# Patient Record
Sex: Male | Born: 1960 | Race: White | Hispanic: No | Marital: Married | Smoking: Never smoker
Health system: Southern US, Community
[De-identification: ages and names within clinical notes are randomized; demographics above are authoritative.]

## PROBLEM LIST (undated history)

## (undated) DIAGNOSIS — C801 Malignant (primary) neoplasm, unspecified: Secondary | ICD-10-CM

## (undated) DIAGNOSIS — N4400 Torsion of testis, unspecified: Secondary | ICD-10-CM

## (undated) HISTORY — PX: APPENDECTOMY: SHX54

## (undated) HISTORY — DX: Torsion of testis, unspecified: N44.00

## (undated) HISTORY — DX: Malignant (primary) neoplasm, unspecified: C80.1

## (undated) HISTORY — PX: TESTICLE TORSION REDUCTION: SHX795

---

## 2000-12-16 ENCOUNTER — Encounter: Admission: RE | Admit: 2000-12-16 | Discharge: 2001-02-05 | Payer: Self-pay | Admitting: Family Medicine

## 2004-05-23 ENCOUNTER — Encounter: Admission: RE | Admit: 2004-05-23 | Discharge: 2004-05-23 | Payer: Self-pay | Admitting: Family Medicine

## 2006-01-13 ENCOUNTER — Ambulatory Visit: Payer: Self-pay | Admitting: Family Medicine

## 2006-03-20 IMAGING — CR DG CHEST 2V
2 series · 2 of 2 positions shown · non-contrast
Comparison: none

CLINICAL DATA: Fever off and on for two weeks.  Non productive cough.  Hoarseness.  Non smoker. 
 PA AND LATERAL CHEST: 
 No comparison. 
 Hyperinflation of the lung zones without evidence of infiltrate, congestive heart failure, or pneumothorax.  Mediastinal and cardiac silhouette within normal limits.  If there is a persistent unexplained cough or hoarseness, chest CT to exclude underlying radiographically occult pulmonary malignancy recommended.

[view not recorded (1 of 2)]
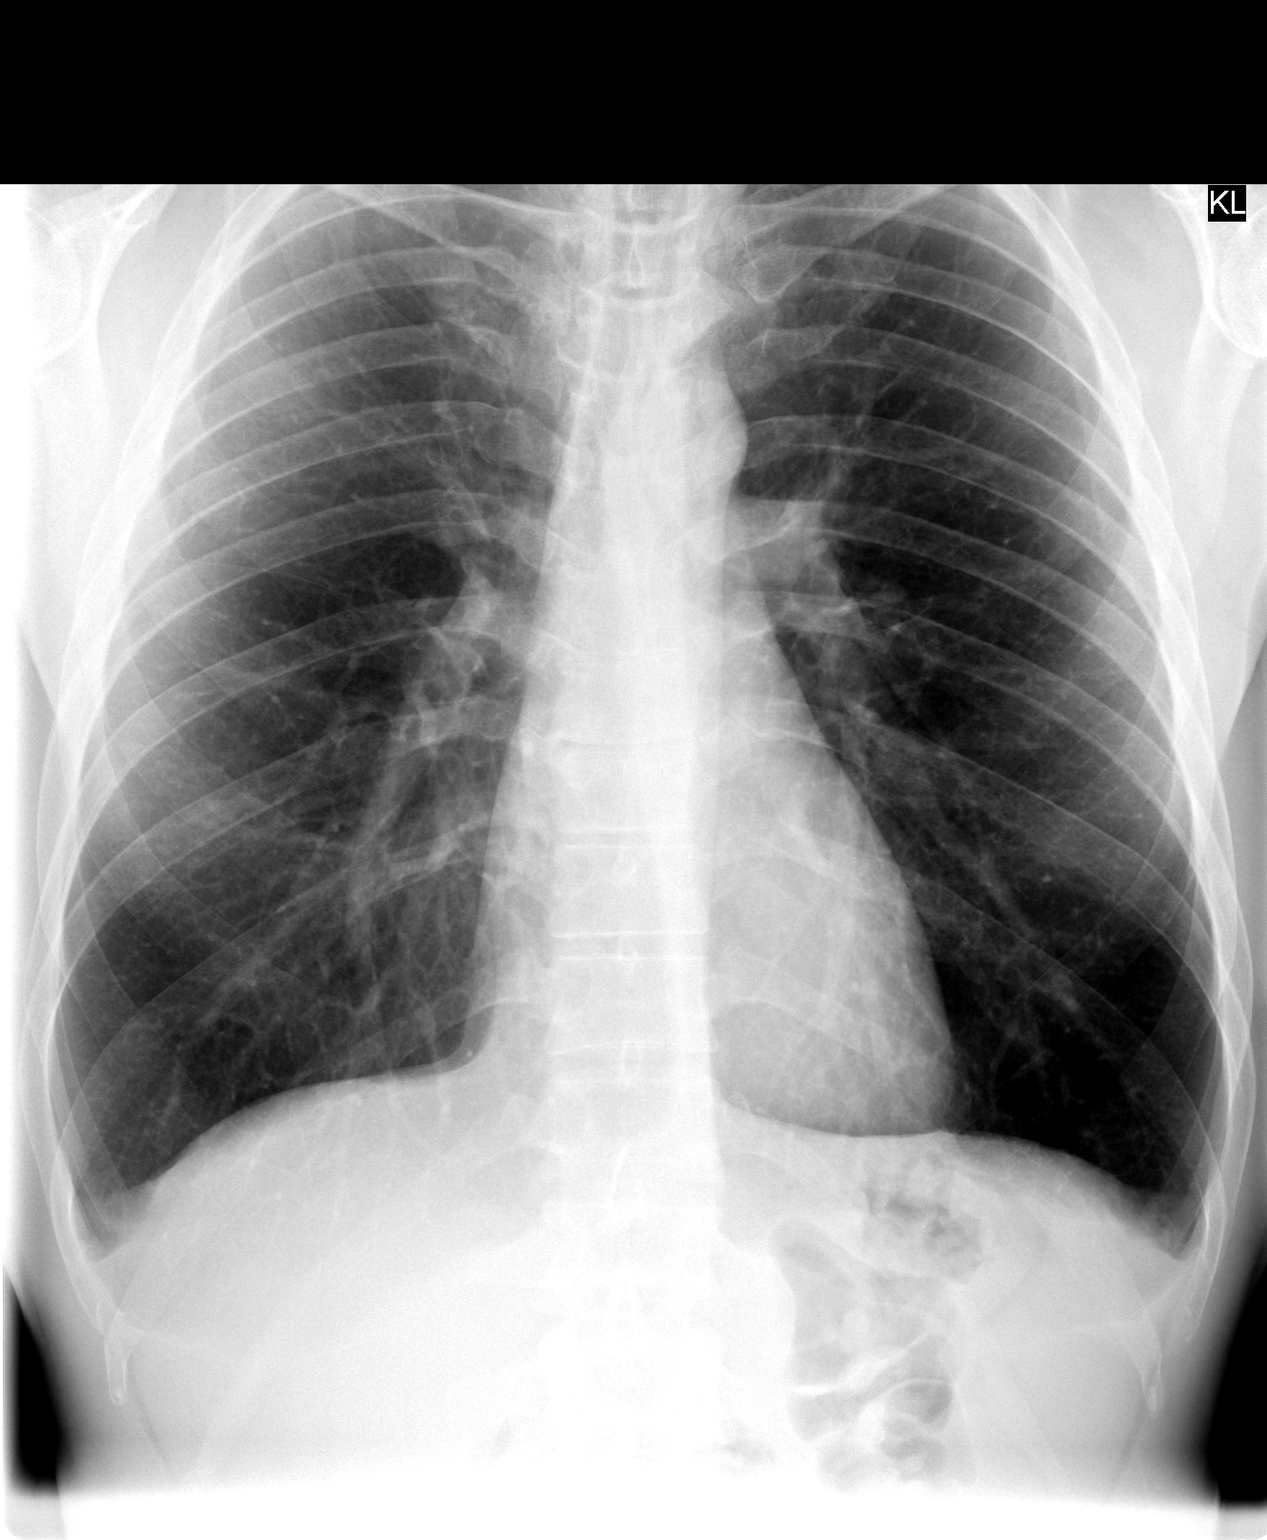

[view not recorded (2 of 2)]
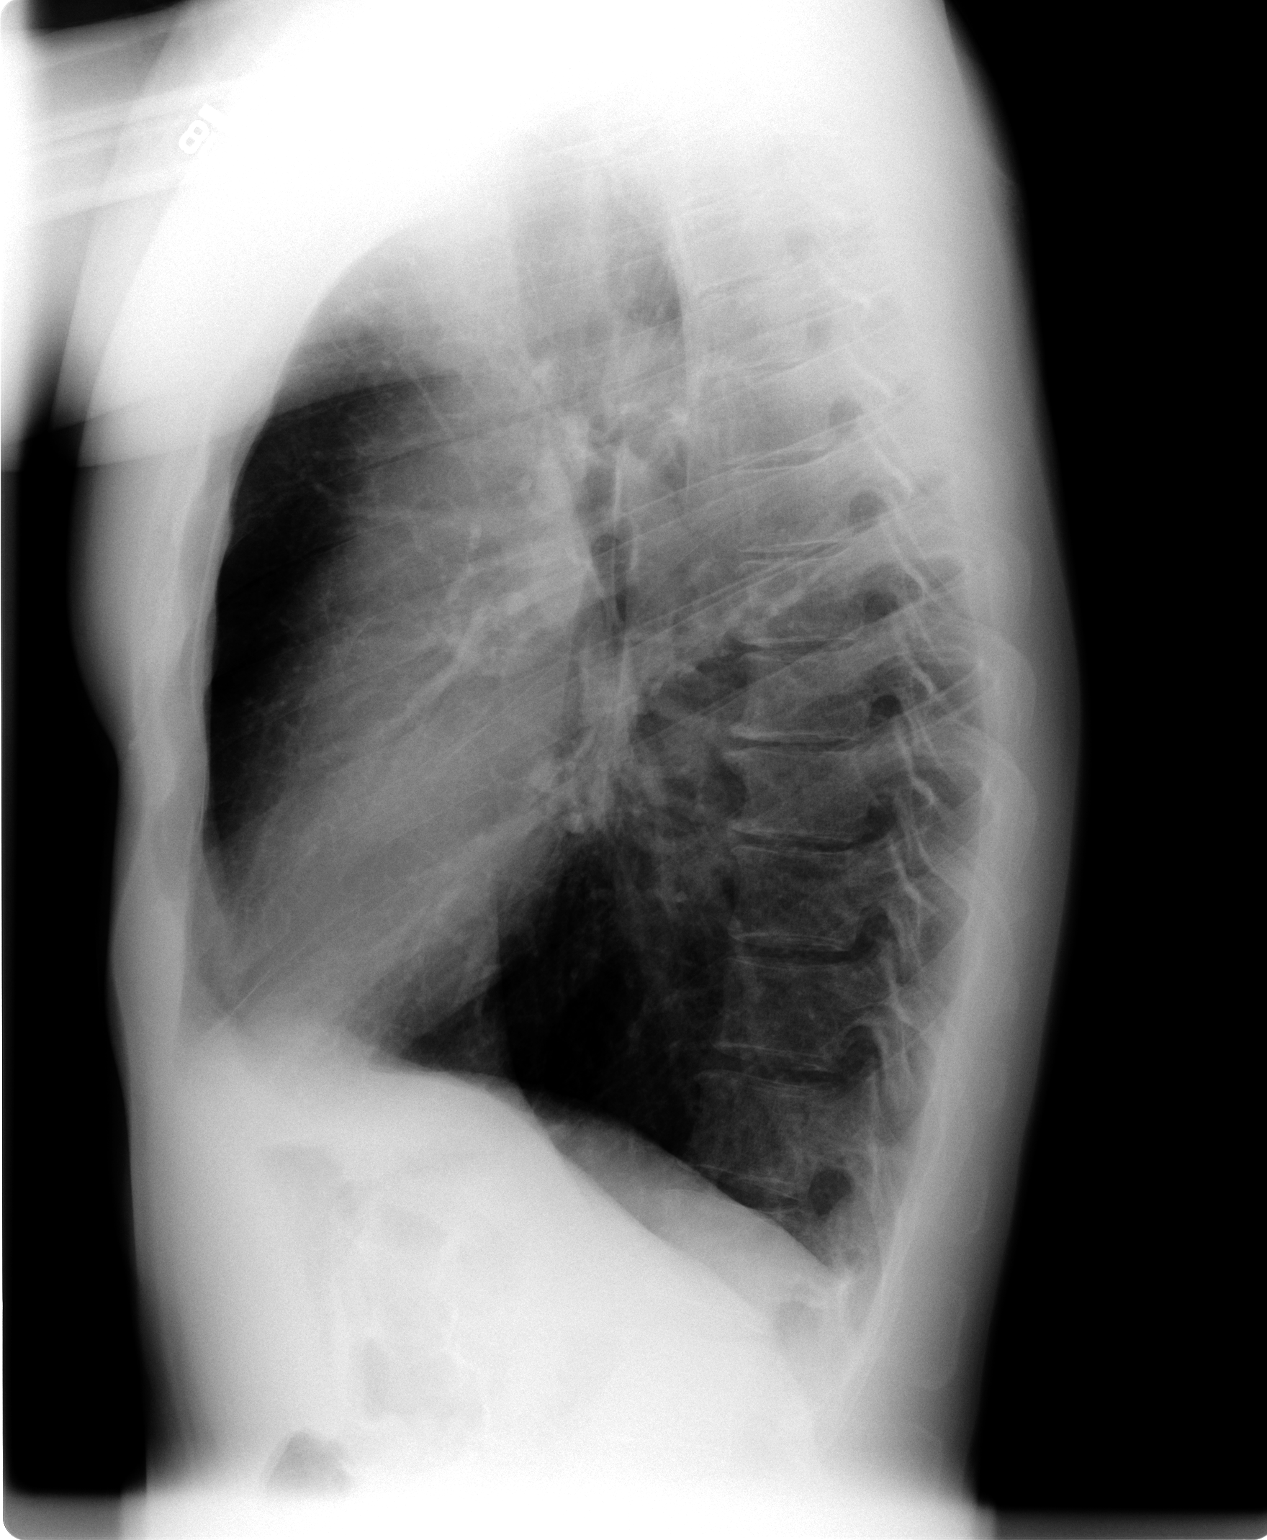

[2 of 2 positions shown; findings below may reference images not displayed]

IMPRESSION: Hyperinflation of the lung zones without evidence of infiltrative or congestive heart failure.  Follow up as noted above.

## 2006-04-16 ENCOUNTER — Ambulatory Visit: Payer: Self-pay | Admitting: Family Medicine

## 2006-04-16 LAB — CONVERTED CEMR LAB
Glucose, Bld: 0 mg/dL — CL (ref 70–99)
LDL Cholesterol: 132 mg/dL — ABNORMAL HIGH (ref 0–99)
LDL DIRECT: 125.7 mg/dL
PSA: 1.63 ng/mL (ref 0.10–4.00)
VLDL: 15 mg/dL (ref 0–40)

## 2006-04-17 ENCOUNTER — Encounter (INDEPENDENT_AMBULATORY_CARE_PROVIDER_SITE_OTHER): Payer: Self-pay | Admitting: Family Medicine

## 2006-04-17 LAB — CONVERTED CEMR LAB: Glucose, Bld: 90 mg/dL (ref 70–99)

## 2008-05-15 ENCOUNTER — Emergency Department (HOSPITAL_BASED_OUTPATIENT_CLINIC_OR_DEPARTMENT_OTHER): Admission: EM | Admit: 2008-05-15 | Discharge: 2008-05-15 | Payer: Self-pay | Admitting: Emergency Medicine

## 2008-05-16 ENCOUNTER — Ambulatory Visit: Payer: Self-pay | Admitting: Family Medicine

## 2008-05-16 DIAGNOSIS — R197 Diarrhea, unspecified: Secondary | ICD-10-CM | POA: Insufficient documentation

## 2008-05-17 ENCOUNTER — Encounter: Payer: Self-pay | Admitting: Family Medicine

## 2008-05-19 ENCOUNTER — Telehealth (INDEPENDENT_AMBULATORY_CARE_PROVIDER_SITE_OTHER): Payer: Self-pay | Admitting: *Deleted

## 2008-05-27 ENCOUNTER — Encounter (INDEPENDENT_AMBULATORY_CARE_PROVIDER_SITE_OTHER): Payer: Self-pay | Admitting: *Deleted

## 2008-05-27 ENCOUNTER — Ambulatory Visit: Payer: Self-pay | Admitting: Family Medicine

## 2008-05-27 LAB — CONVERTED CEMR LAB
ALT: 24 units/L (ref 0–53)
AST: 26 units/L (ref 0–37)
Albumin: 4.2 g/dL (ref 3.5–5.2)
BUN: 23 mg/dL (ref 6–23)
Basophils Relative: 0.2 % (ref 0.0–3.0)
Bilirubin, Direct: 0.1 mg/dL (ref 0.0–0.3)
Calcium: 9.4 mg/dL (ref 8.4–10.5)
Chloride: 102 meq/L (ref 96–112)
Creatinine, Ser: 1 mg/dL (ref 0.4–1.5)
Direct LDL: 125.8 mg/dL
Eosinophils Absolute: 0.1 10*3/uL (ref 0.0–0.7)
Eosinophils Relative: 1.9 % (ref 0.0–5.0)
GFR calc Af Amer: 103 mL/min
GFR calc non Af Amer: 85 mL/min
HCT: 42.1 % (ref 39.0–52.0)
HDL: 59.9 mg/dL (ref >39.0)
Hemoglobin: 14.4 g/dL (ref 13.0–17.0)
MCHC: 34.2 g/dL (ref 30.0–36.0)
MCV: 88.7 fL (ref 78.0–100.0)
Monocytes Absolute: 0.4 10*3/uL (ref 0.1–1.0)
Neutro Abs: 3.7 10*3/uL (ref 1.4–7.7)
Neutrophils Relative %: 72.8 % (ref 43.0–77.0)
RBC: 4.75 M/uL (ref 4.22–5.81)
Sed Rate: 10 mm/hr (ref 0–16)
TSH: 1.37 microintl units/mL (ref 0.35–5.50)
WBC: 5.1 10*3/uL (ref 4.5–10.5)

## 2008-05-28 ENCOUNTER — Encounter: Payer: Self-pay | Admitting: Family Medicine

## 2008-06-01 ENCOUNTER — Telehealth (INDEPENDENT_AMBULATORY_CARE_PROVIDER_SITE_OTHER): Payer: Self-pay | Admitting: *Deleted

## 2008-06-09 ENCOUNTER — Telehealth (INDEPENDENT_AMBULATORY_CARE_PROVIDER_SITE_OTHER): Payer: Self-pay | Admitting: *Deleted

## 2009-09-08 ENCOUNTER — Telehealth (INDEPENDENT_AMBULATORY_CARE_PROVIDER_SITE_OTHER): Payer: Self-pay | Admitting: *Deleted

## 2009-09-12 ENCOUNTER — Telehealth (INDEPENDENT_AMBULATORY_CARE_PROVIDER_SITE_OTHER): Payer: Self-pay | Admitting: *Deleted

## 2010-01-29 ENCOUNTER — Encounter (INDEPENDENT_AMBULATORY_CARE_PROVIDER_SITE_OTHER): Payer: Self-pay | Admitting: *Deleted

## 2010-01-29 LAB — CONVERTED CEMR LAB
Albumin: 4.4 g/dL
BUN: 27 mg/dL
Calcium: 9.1 mg/dL
Creatinine, Ser: 0.97 mg/dL
HCT: 44.2 %
HDL: 62 mg/dL
LDL Cholesterol: 101 mg/dL
MCV: 90.8 fL
RBC count: 4.9 10*6/uL
Triglycerides: 116 mg/dL
WBC, blood: 4.2 10*3/uL

## 2010-05-10 ENCOUNTER — Encounter (INDEPENDENT_AMBULATORY_CARE_PROVIDER_SITE_OTHER): Payer: Self-pay | Admitting: *Deleted

## 2010-05-10 ENCOUNTER — Encounter (INDEPENDENT_AMBULATORY_CARE_PROVIDER_SITE_OTHER): Payer: Commercial Managed Care - PPO | Admitting: Family Medicine

## 2010-05-10 ENCOUNTER — Other Ambulatory Visit: Payer: Self-pay | Admitting: Family Medicine

## 2010-05-10 ENCOUNTER — Encounter: Payer: Self-pay | Admitting: Family Medicine

## 2010-05-10 ENCOUNTER — Ambulatory Visit: Admit: 2010-05-10 | Payer: Self-pay | Admitting: Family Medicine

## 2010-05-10 DIAGNOSIS — Z0000 Encounter for general adult medical examination without abnormal findings: Secondary | ICD-10-CM

## 2010-05-10 DIAGNOSIS — R6882 Decreased libido: Secondary | ICD-10-CM | POA: Insufficient documentation

## 2010-05-10 DIAGNOSIS — E162 Hypoglycemia, unspecified: Secondary | ICD-10-CM | POA: Insufficient documentation

## 2010-05-10 DIAGNOSIS — Z20828 Contact with and (suspected) exposure to other viral communicable diseases: Secondary | ICD-10-CM

## 2010-05-10 NOTE — Progress Notes (Signed)
Summary: wants rx sent to [facility]  Phone Note Call from Patient Call back at Work Phone [phone]   Caller: Patient Summary of Call: pt called wants rx sent to [facility] instead ov [facility](did not pickup)  RX REFAXED   left msg for pt.Kandice Hams  September 12, 2009 2:37 PM   Initial call taken by: Kandice Hams,  September 12, 2009 2:43 PM    Prescriptions: CIPROFLOXACIN HCL 500 MG TABS (CIPROFLOXACIN HCL) take one tablet two times a day  #10 x 0   Entered by:   Kandice Hams   Authorized by:   Neena Rhymes MD   Signed by:   Kandice Hams on 09/12/2009   Method used:   Re-Faxed to ...       [facility]* (retail)       [address]       [address] Shipping/mailing       [place], [place]  [address]       Ph: [phone]       Fax: [phone]   RxID:   [id number]

## 2010-05-10 NOTE — Progress Notes (Signed)
Summary: Meds for trip  Phone Note Call from Patient Call back at Home Phone [phone] Call back at Work Phone [phone]   Caller: Patient Reason for Call: Refill Medication Summary of Call: Patient is going to [place] again in August and would like a rx for doxycycline?? for the trip so he doesn't get sick again.   Pharmacy is [facility] on [address].  Initial call taken by: Harold Barban,  September 08, 2009 1:03 PM  Follow-up for Phone Call        ok for cipro 500mg  two times a day #10, no refills in case of travellers diarrhea (this is what pt had last time and is more appropriate than doxy) Follow-up by: Neena Rhymes MD,  September 08, 2009 1:15 PM  Additional Follow-up for Phone Call Additional follow up Details #1::        left message on machine ............Marland KitchenDoristine Devoid  September 08, 2009 3:13 PM   spoke w/ patient aware prescription to be called into pharmacy........Marland KitchenDoristine Devoid  September 08, 2009 3:47 PM     New/Updated Medications: CIPROFLOXACIN HCL 500 MG TABS (CIPROFLOXACIN HCL) take one tablet daily CIPROFLOXACIN HCL 500 MG TABS (CIPROFLOXACIN HCL) take one tablet two times a day Prescriptions: CIPROFLOXACIN HCL 500 MG TABS (CIPROFLOXACIN HCL) take one tablet two times a day  #10 x 0   Entered by:   Doristine Devoid   Authorized by:   Neena Rhymes MD   Signed by:   Doristine Devoid on 09/08/2009   Method used:   Telephoned to ...       [facility] [address] [id number]* (retail)       [address]       [place], [place]  [address]       Ph: [phone]       Fax: [phone]   RxID:   [id number] CIPROFLOXACIN HCL 500 MG TABS (CIPROFLOXACIN HCL) take one tablet daily  #10 x 0   Entered by:   Doristine Devoid   Authorized by:   Neena Rhymes MD   Signed by:   Doristine Devoid on 09/08/2009   Method used:   Electronically to        [facility] [address] [facility] [id number]* (retail)       [address]       [place], [place]  [address]       Ph: [phone]       Fax: [phone]   RxID:   [id number]

## 2010-05-11 ENCOUNTER — Telehealth (INDEPENDENT_AMBULATORY_CARE_PROVIDER_SITE_OTHER): Payer: Self-pay | Admitting: *Deleted

## 2010-05-14 ENCOUNTER — Encounter: Payer: Self-pay | Admitting: Family Medicine

## 2010-05-14 LAB — CONVERTED CEMR LAB: HIV: NONREACTIVE

## 2010-05-16 NOTE — Miscellaneous (Signed)
  Clinical Lists Changes  Observations: Added new observation of TRIGLYC TOT: 116 mg/dL (16/01/9603 54:09) Added new observation of LDL: 101 mg/dL (81/19/1478 29:56) Added new observation of HDL: 62 mg/dL (21/30/8657 84:69) Added new observation of CHOLESTEROL: 186 mg/dL (62/95/2841 32:44) Added new observation of BILI TOTAL: 0.80 mg/dL (04/10/7251 66:44) Added new observation of ALK PHOS: 49 units/L (01/29/2010 14:03) Added new observation of SGPT (ALT): 14 units/L (01/29/2010 14:03) Added new observation of SGOT (AST): 23 units/L (01/29/2010 14:03) Added new observation of PROTEIN, TOT: 6.8 g/dL (03/47/4259 56:38) Added new observation of ALBUMIN: 4.4 g/dL (75/64/3329 51:88) Added new observation of CALCIUM: 9.1 mg/dL (41/66/0630 16:01) Added new observation of GLUCOSE SER: 91 mg/dL (09/32/3557 32:20) Added new observation of CREATININE: 0.97 mg/dL (25/42/7062 37:62) Added new observation of BUN: 27 mg/dL (83/15/1761 60:73) Added new observation of CO2 TOTAL: 25 mmol/L (01/29/2010 14:03) Added new observation of CHLORIDE: 105 mmol/L (01/29/2010 14:03) Added new observation of POTASSIUM: 4.9 mmol/L (01/29/2010 14:03) Added new observation of SODIUM: 141 mmol/L (01/29/2010 14:03) Added new observation of PLATELETS: 205 10*3/mm3 (01/29/2010 14:03) Added new observation of MCH: 29.6 pg (01/29/2010 14:03) Added new observation of MCV: 90.8 fL (01/29/2010 14:03) Added new observation of HCT: 44.2 % (01/29/2010 14:03) Added new observation of HGB: 14.4 g/dL (71/09/2692 85:46) Added new observation of RBC: 4.9 10*6/mm3 (01/29/2010 14:03) Added new observation of WBC: 4.2 10*3/mm3 (01/29/2010 14:03)

## 2010-05-16 NOTE — Assessment & Plan Note (Signed)
Summary: CPX AND FASTING LABS///SPH   Vital Signs:  Patient profile:   50 year old male Height:      66 inches Weight:      145 pounds BMI:     23.49 Pulse rate:   62 / minute BP sitting:   110 / 72  (left arm)  Vitals Entered By: Doristine Devoid CMA (May 10, 2010 8:08 AM) CC: CPX   History of Present Illness: 50 yo man here today for CPE.  had labs done recently for health insurance- they look great  1) nausea- occuring every few weeks.  will have some light headedness.  sxs improve w/ eating.  sxs started w/in last year.  eating regularly.  drinking plenty of fluids.  2) low sex drive- concerned about testosterone levels.  reports more easily fatigued.  reports he used to easily gain muscle when working out but now must work harder.  denies ED.  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        Drug Use:  never.    Current Medications (verified): 1)  None  Allergies (verified): 1)  ! 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    Physical Exam  General:  Well-developed,well-nourished,in no acute distress; alert,appropriate and cooperative throughout examination Head:  Normocephalic and atraumatic without obvious abnormalities.  No apparent alopecia or balding. Eyes:  No corneal or conjunctival inflammation noted. EOMI. Perrla. Funduscopic exam benign, without hemorrhages, exudates or papilledema. Vision grossly normal. Ears:  External ear exam shows no significant lesions or deformities.  Otoscopic examination reveals clear canals, tympanic membranes are intact bilaterally without bulging, retraction, inflammation or discharge. Hearing is grossly normal bilaterally. Nose:  External nasal examination shows no deformity or inflammation. Nasal mucosa are pink and moist without lesions or exudates. Mouth:  Oral mucosa and oropharynx without lesions or exudates.  Teeth in good repair. Neck:  No deformities, masses, or tenderness noted. Lungs:  Normal respiratory effort, chest expands symmetrically. Lungs are clear to auscultation, no crackles or wheezes. Heart:  Normal rate and regular rhythm. S1 and S2 normal without gallop, murmur, click, rub or other extra sounds. Abdomen:  Bowel sounds positive,abdomen soft and non-tender without masses, organomegaly or hernias noted. Rectal:  No external abnormalities noted. Normal sphincter tone. No rectal masses or tenderness. Genitalia:  Testes bilaterally descended without nodularity, tenderness or masses. No scrotal masses or lesions. No penis lesions or urethral discharge. Prostate:  Prostate gland firm and smooth, no enlargement, nodularity, tenderness, mass, asymmetry or induration. Pulses:  +2 carotid, radial, DP Extremities:  No clubbing, cyanosis, edema, or deformity noted with normal full range of motion of all joints.   Neurologic:  No cranial nerve deficits noted. Station and gait are normal. Plantar reflexes are down-going bilaterally. DTRs are symmetrical throughout. Sensory, motor and coordinative functions appear intact. Skin:  Intact without suspicious  lesions or rashes Cervical Nodes:  No lymphadenopathy noted Inguinal Nodes:  No significant adenopathy Psych:   Cognition and judgment appear intact. Alert and cooperative with normal attention span and concentration. No apparent delusions, illusions, hallucinations   Impression & Recommendations:  Problem # 1:  HEALTHY ADULT MALE (ICD-V70.0) Assessment Unchanged pt's PE WNL.  labs reviewed from recent health screen.  EKG done as baseline.  anticipatory guidance provided. Orders: TLB-PSA (Prostate Specific Antigen) (84153-PSA) Specimen Handling (16109) EKG w/ Interpretation (93000)  Problem # 2:  DECREASED LIBIDO (ICD-799.81) Assessment: New given pt's low sex drive and some fatigue will check testosterone level.  reviewed this may be normal consequence of aging.  will follow. Orders: Venipuncture (60454) TLB-Testosterone, Total (84403-TESTO) Specimen Handling (09811)  Problem # 3:  CONTACT OR EXPOSURE TO OTHER VIRAL DISEASES (ICD-V01.79) Assessment: New partner w/ HIV. 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  discussed importance of protein at meals to avoid rapid spikes in blood sugar.  reviewed supportive care and red flags that should prompt return.  Pt expresses understanding and is in agreement w/ this plan.  Patient Instructions: 1)  Your exam looks great!  Keep up the good work! 2)  We'll notify you of your lab results 3)  The nausea and light headedness sounds like it is low blood sugar in response to a carb load.  Try and eat protein at every meal to level out the blood sugar 4)  Your decrease drive is most likely a normal aging process but we'll check the testosterone 5)  Call with any questions or concerns 6)  Happy Birthday!   Orders Added: 1)  Venipuncture [36415] 2)  TLB-Testosterone, Total [84403-TESTO] 3)  TLB-PSA (Prostate Specific Antigen)  [84153-PSA] 4)  T-HIV Antibody  (Reflex) [[id number]] 5)  Specimen Handling [99000] 6)  EKG w/ Interpretation [93000] 7)  Est. Patient 40-64 years [99396] 8)  Est. Patient Level III [28413]

## 2010-05-23 DIAGNOSIS — E291 Testicular hypofunction: Secondary | ICD-10-CM | POA: Insufficient documentation

## 2010-05-24 NOTE — Progress Notes (Addendum)
Summary: labs-lmom   Phone Note Outgoing Call   Call placed by: Doristine Devoid CMA,  May 11, 2010 9:23 AM Call placed to: Patient Summary of Call: testosterone is low.  will refer to endo for complete w/u.   pt aware.... Almeta Monas CMA Duncan Dull)  May 14, 2010 1:55 PM   Follow-up for Phone Call        left message on machine ...Marland KitchenMarland KitchenDoristine Devoid CMA  May 11, 2010 9:23 AM      Appended Document: Orders Update    Clinical Lists Changes  Problems: Added new problem of OTHER TESTICULAR HYPOFUNCTION (ICD-257.2) Orders: Added new Referral order of Endocrinology Referral (Endocrine) - Signed

## 2010-06-13 ENCOUNTER — Ambulatory Visit: Payer: Commercial Managed Care - PPO | Admitting: Endocrinology

## 2010-06-19 ENCOUNTER — Encounter: Payer: Self-pay | Admitting: Endocrinology

## 2010-06-19 ENCOUNTER — Ambulatory Visit (INDEPENDENT_AMBULATORY_CARE_PROVIDER_SITE_OTHER): Payer: 59 | Admitting: Endocrinology

## 2010-06-19 DIAGNOSIS — E291 Testicular hypofunction: Secondary | ICD-10-CM

## 2010-06-26 NOTE — Assessment & Plan Note (Signed)
Summary: New Endo/other testicular-hypo function/tabori/[facility]/   Vital Signs:  Patient profile:   50 year old male Height:      66 inches (167.64 cm) Weight:      148 pounds (67.27 kg) BMI:     23.97 O2 Sat:      99 % on Room air Temp:     98.5 degrees F (36.94 degrees C) oral Pulse rate:   49 / minute BP sitting:   110 / 64  (left arm) Cuff size:   regular  Vitals Entered By: Brenton Grills CMA Duncan Dull) (June 19, 2010 8:41 AM)  O2 Flow:  Room air CC: New Endo Consult/Other Testicular Hypofunction/Dr. Tabori/aj Is Patient Diabetic? No   Referring Provider:  Neena Rhymes MD Primary Provider:  Neena Rhymes MD  CC:  New Endo Consult/Other Testicular Hypofunction/Dr. Tabori/aj.  History of Present Illness: pt states 1 year of decreased muscle mass at the arms and legs, and assoc decreased libido.    Current Medications (verified): 1)  None  Allergies (verified): 1)  ! 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 coronary artery disease-maternal and paternal grandparent neg for testosterone problem.  Social History: Reviewed history from 05/10/2010 and no changes required. 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  he reports fatigue.  he reports urinary frequency and easy bruising.     Physical Exam  General:  normal appearance.   Head:  head: no deformity eyes: no periorbital swelling, no proptosis external nose and  ears are normal mouth: no lesion seen Neck:  Supple without thyroid enlargement or tenderness.  Breasts:  no gynecomastia Lungs:  Clear to auscultation bilaterally. Normal respiratory effort.  Heart:  Regular rate and rhythm without murmurs or gallops noted. Normal S1,S2.   Abdomen:  abdomen is soft, nontender.  no hepatosplenomegaly.   not distended.  no hernia  Genitalia:  Normal external male genitalia, except tetes are slightly small and soft (may be within the range of normal).  no urethral discharge.  Msk:  muscle bulk and strength are grossly normal.  no obvious joint swelling.  gait is normal and steady  Pulses:  dorsalis pedis intact bilat. Extremities:  no deformity.  no edema  Neurologic:  cn 2-12 grossly intact.   readily moves all 4's.   sensation is intact to touch on the feet Skin:  normal texture and temp.  no rash.  not diaphoretic hair distribution is decreased, but probably withn the normal range Cervical Nodes:  No significant adenopathy.  Psych:  Alert and cooperative; normal mood and affect; normal attention span and concentration.   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  however, there  are "theoretical" risks, including increased fertility, hair loss, prostate cancer, benign prostate enlargement, lower hdl ("good cholesterol"), sleep apnea, and behavior changes 2)  please consider options (including no treatment for now, and just recheck in 6-12 months), and let me know if you want treatment.  if you do, the next step would be to do other blood tests to classify the  cause as from the pituitary (in the brain), or from the testicles).     Orders Added: 1)  Consultation Level IV [40981]

## 2010-07-24 LAB — COMPREHENSIVE METABOLIC PANEL
AST: 27 U/L (ref 0–37)
Albumin: 4.2 g/dL (ref 3.5–5.2)
Alkaline Phosphatase: 52 U/L (ref 39–117)
Chloride: 101 mEq/L (ref 96–112)
Creatinine, Ser: 1 mg/dL (ref 0.4–1.5)
GFR calc Af Amer: >60 mL/min (ref >60)
Potassium: 4.3 mEq/L (ref 3.5–5.1)
Total Bilirubin: 0.6 mg/dL (ref 0.3–1.2)

## 2010-07-24 LAB — CLOSTRIDIUM DIFFICILE EIA: C difficile Toxins A+B, EIA: NEGATIVE

## 2010-07-24 LAB — DIFFERENTIAL
Basophils Absolute: 0 10*3/uL (ref 0.0–0.1)
Lymphs Abs: 0.8 10*3/uL (ref 0.7–4.0)
Monocytes Relative: 32 % — ABNORMAL HIGH (ref 3–12)
Neutro Abs: 1.7 10*3/uL (ref 1.7–7.7)

## 2010-07-24 LAB — URINALYSIS, ROUTINE W REFLEX MICROSCOPIC
Bilirubin Urine: NEGATIVE
Glucose, UA: NEGATIVE mg/dL
Hgb urine dipstick: NEGATIVE
Specific Gravity, Urine: 1.017 (ref 1.005–1.030)
Urobilinogen, UA: 0.2 mg/dL (ref 0.0–1.0)

## 2010-07-24 LAB — CBC
Platelets: 186 10*3/uL (ref 150–400)
WBC: 3.8 10*3/uL — ABNORMAL LOW (ref 4.0–10.5)

## 2010-08-06 ENCOUNTER — Encounter: Payer: Self-pay | Admitting: Family Medicine

## 2010-08-06 ENCOUNTER — Ambulatory Visit (INDEPENDENT_AMBULATORY_CARE_PROVIDER_SITE_OTHER): Payer: 59 | Admitting: Family Medicine

## 2010-08-06 VITALS — BP 100/70 | Temp 98.6°F | Wt 148.4 lb

## 2010-08-06 DIAGNOSIS — R29898 Other symptoms and signs involving the musculoskeletal system: Secondary | ICD-10-CM

## 2010-08-06 NOTE — Progress Notes (Signed)
  Subjective:    Patient ID: Jose Jacobson, male    DOB: 03-21-1961, 50 y.o.   MRN: [id number]  HPI L knee popping- sxs started last week while walking down stairs.  Denies knee pain, injury.  Pt very active- does a lot of biking.  No swelling, redness.   Review of Systems For ROS see HPI     Objective:   Physical Exam  Constitutional: He appears well-developed and well-nourished. No distress.  Musculoskeletal:       Left knee: He exhibits normal range of motion, no swelling, no effusion, no ecchymosis, no deformity, no erythema, normal alignment and normal patellar mobility. no tenderness found. No medial joint line, no lateral joint line and no patellar tendon tenderness noted.       + crepitus w/ flexion and extension of L knee (no pain) + 'snapping' sound w/ L leg squat          Assessment & Plan:

## 2010-08-06 NOTE — Patient Instructions (Signed)
I'll get back to you regarding the knee popping If it becomes painful- let me know about it At this point, i see know reason to limit your activities. Call with any questions or concerns Hang in there!!!

## 2010-08-07 DIAGNOSIS — R29898 Other symptoms and signs involving the musculoskeletal system: Secondary | ICD-10-CM | POA: Insufficient documentation

## 2010-08-07 NOTE — Assessment & Plan Note (Addendum)
Noise heard is more of a snapping than a clicking- painless.  PE WNL.  Will ask sports med if there is anything to be done but given absence of pain and normal exam reassured pt that there is no need to limit his activities.  Spoke w/ sports med and they recommended VMO strengthening but no additonal w/u at this time.  Will let pt know.

## 2010-08-20 ENCOUNTER — Telehealth: Payer: Self-pay | Admitting: *Deleted

## 2010-08-20 NOTE — Telephone Encounter (Signed)
Left message on voicemail to call the office

## 2010-08-20 NOTE — Telephone Encounter (Signed)
Via staff message Dr. Beverely Low advised:    Please call him and let him know that sports med doesn't recommend additional w/u for his knee 'snapping' as long as it remains painless. They suggested VMO (medial thigh strengthening) by sitting w/ legs straight out, turning toe outward and lifting leg 30 times- 3 sets. He should call w/ questions or concerns.  Thanks!

## 2010-08-21 NOTE — Telephone Encounter (Signed)
Discuss with patient  

## 2010-10-08 ENCOUNTER — Telehealth: Payer: Self-pay | Admitting: *Deleted

## 2010-10-08 MED ORDER — CIPROFLOXACIN HCL 500 MG PO TABS: 500.0000 mg | ORAL_TABLET | Freq: Two times a day (BID) | ORAL | Status: AC

## 2010-10-08 NOTE — Telephone Encounter (Signed)
Ok for Cipro 500mg  1 tab po bid x7 days (#14, no refills)

## 2010-10-08 NOTE — Telephone Encounter (Signed)
Pt is planning a trip to [place] from August 2-13. Pt states that the last time he went a med was Rx just in case. Pt is requesting another Rx to take with him. Please advise

## 2010-10-08 NOTE — Telephone Encounter (Signed)
Left Pt detail message Rx sent to pharmacy.  

## 2011-03-05 ENCOUNTER — Telehealth: Payer: Self-pay

## 2011-03-05 MED ORDER — CIPROFLOXACIN HCL 500 MG PO TABS: 500.0000 mg | ORAL_TABLET | Freq: Two times a day (BID) | ORAL | Status: AC

## 2011-03-05 NOTE — Telephone Encounter (Signed)
Pt requesting the abx that has been Rx'ed by Dr. Beverely Low in the past for when he goes to [place]. He states that he is going there soon.

## 2011-03-05 NOTE — Telephone Encounter (Signed)
rx sent to pharmacy by e-script  

## 2011-03-05 NOTE — Telephone Encounter (Signed)
Ok for Cipro 500mg  bid x7 days, #14, no refills

## 2011-07-02 ENCOUNTER — Telehealth: Payer: Self-pay | Admitting: *Deleted

## 2011-07-02 NOTE — Telephone Encounter (Signed)
Pt states that he usually get Rx for cipro when he travels to [place].Please advise

## 2011-07-03 MED ORDER — CIPROFLOXACIN HCL 500 MG PO TABS: 500.0000 mg | ORAL_TABLET | Freq: Two times a day (BID) | ORAL | Status: AC

## 2011-07-03 NOTE — Telephone Encounter (Signed)
Ok for Cipro 500mg bid x7 days, #14, no refills 

## 2011-07-03 NOTE — Telephone Encounter (Signed)
Patient aware Rx is being sent and he agreed to send to the [facility] outpatient pharmacy.      KP

## 2011-07-03 NOTE — Telephone Encounter (Signed)
Left message to call office

## 2012-03-18 ENCOUNTER — Other Ambulatory Visit: Payer: Self-pay | Admitting: *Deleted

## 2012-03-18 DIAGNOSIS — Z792 Long term (current) use of antibiotics: Secondary | ICD-10-CM

## 2012-03-18 MED ORDER — CIPROFLOXACIN HCL 500 MG PO TABS: 500.0000 mg | ORAL_TABLET | Freq: Two times a day (BID) | ORAL | Status: DC

## 2012-03-18 NOTE — Telephone Encounter (Signed)
Rx for Cipro sent to [facility], pt notified.  for trip to [place]. SGJ, RN

## 2013-04-08 DIAGNOSIS — C801 Malignant (primary) neoplasm, unspecified: Secondary | ICD-10-CM

## 2013-04-08 HISTORY — DX: Malignant (primary) neoplasm, unspecified: C80.1

## 2014-06-07 ENCOUNTER — Telehealth: Payer: Self-pay | Admitting: *Deleted

## 2014-06-07 ENCOUNTER — Encounter: Payer: Self-pay | Admitting: *Deleted

## 2014-06-07 NOTE — Telephone Encounter (Signed)
Pre-Visit Call Completed with patient and chart updated.    Pre-Visit Info documented in Specialty Comments under [employer]. eal

## 2014-06-09 ENCOUNTER — Encounter: Payer: Self-pay | Admitting: Family Medicine

## 2014-06-09 ENCOUNTER — Ambulatory Visit (INDEPENDENT_AMBULATORY_CARE_PROVIDER_SITE_OTHER): Payer: 59 | Admitting: Family Medicine

## 2014-06-09 VITALS — BP 124/82 | HR 61 | Temp 98.0°F | Resp 16 | Ht 68.0 in | Wt 148.2 lb

## 2014-06-09 DIAGNOSIS — Z0000 Encounter for general adult medical examination without abnormal findings: Secondary | ICD-10-CM

## 2014-06-09 DIAGNOSIS — Z125 Encounter for screening for malignant neoplasm of prostate: Secondary | ICD-10-CM

## 2014-06-09 DIAGNOSIS — Z23 Encounter for immunization: Secondary | ICD-10-CM

## 2014-06-09 DIAGNOSIS — Z1211 Encounter for screening for malignant neoplasm of colon: Secondary | ICD-10-CM

## 2014-06-09 NOTE — Assessment & Plan Note (Signed)
Pt's PE WNL.  Now that he is 66, he needs referral to GI for colonoscopy.  Tdap updated.  Check labs.  Anticipatory guidance provided.

## 2014-06-09 NOTE — Progress Notes (Signed)
Pre visit review using our clinic review tool, if applicable. No additional management support is needed unless otherwise documented below in the visit note. 

## 2014-06-09 NOTE — Patient Instructions (Signed)
Follow up in 1 year or as needed We'll notify you of your lab results and make any changes if needed Keep up the good work on healthy diet and regular exercise Call with any questions or concerns Happy Spring!!! 

## 2014-06-09 NOTE — Progress Notes (Signed)
   Subjective:    Patient ID: Jose Jacobson, male    DOB: [birth year], 54 y.o.   MRN: [id number]  HPI CPE- pt has not been seen in 4 yrs.  Due for colonoscopy.   Review of Systems Patient reports no vision/hearing changes, anorexia, fever ,adenopathy, persistant/recurrent hoarseness, swallowing issues, chest pain, palpitations, edema, persistant/recurrent cough, hemoptysis, dyspnea (rest,exertional, paroxysmal nocturnal), gastrointestinal  bleeding (melena, rectal bleeding), abdominal pain, excessive heart burn, GU symptoms (dysuria, hematuria, voiding/incontinence issues) syncope, focal weakness, memory loss, numbness & tingling, skin/hair/nail changes, depression, anxiety, abnormal bruising/bleeding, musculoskeletal symptoms/signs.     Objective:   Physical Exam BP 124/82 mmHg  Pulse 61  Temp(Src) 98 F (36.7 C) (Oral)  Resp 16  Ht 5\' 8"  (1.727 m)  Wt 148 lb 4 oz (67.246 kg)  BMI 22.55 kg/m2  SpO2 96%  General Appearance:    Alert, cooperative, no distress, appears stated age  Head:    Normocephalic, without obvious abnormality, atraumatic  Eyes:    PERRL, conjunctiva/corneas clear, EOM's intact, fundi    benign, both eyes       Ears:    Normal TM's and external ear canals, both ears  Nose:   Nares normal, septum midline, mucosa normal, no drainage   or sinus tenderness  Throat:   Lips, mucosa, and tongue normal; teeth and gums normal  Neck:   Supple, symmetrical, trachea midline, no adenopathy;       thyroid:  No enlargement/tenderness/nodules  Back:     Symmetric, no curvature, ROM normal, no CVA tenderness  Lungs:     Clear to auscultation bilaterally, respirations unlabored  Chest wall:    No tenderness or deformity  Heart:    Regular rate and rhythm, S1 and S2 normal, no murmur, rub   or gallop  Abdomen:     Soft, non-tender, bowel sounds active all four quadrants,    no masses, no organomegaly  Genitalia:    Normal male without lesion, discharge or tenderness    Rectal:    Normal tone, normal prostate, no masses or tenderness  Extremities:   Extremities normal, atraumatic, no cyanosis or edema  Pulses:   2+ and symmetric all extremities  Skin:   Skin color, texture, turgor normal, no rashes or lesions  Lymph nodes:   Cervical, supraclavicular, and axillary nodes normal  Neurologic:   CNII-XII intact. Normal strength, sensation and reflexes      throughout          Assessment & Plan:

## 2014-06-10 LAB — LIPID PANEL
CHOLESTEROL: 202 mg/dL — AB (ref 0–200)
HDL: 58.4 mg/dL (ref >39.00)
LDL Cholesterol: 121 mg/dL — ABNORMAL HIGH (ref 0–99)
NonHDL: 143.6
Total CHOL/HDL Ratio: 3
Triglycerides: 112 mg/dL (ref 0.0–149.0)
VLDL: 22.4 mg/dL (ref 0.0–40.0)

## 2014-06-10 LAB — CBC WITH DIFFERENTIAL/PLATELET
Basophils Absolute: 0.1 10*3/uL (ref 0.0–0.1)
Basophils Relative: 0.9 % (ref 0.0–3.0)
EOS PCT: 3.3 % (ref 0.0–5.0)
Eosinophils Absolute: 0.2 10*3/uL (ref 0.0–0.7)
HEMATOCRIT: 41.8 % (ref 39.0–52.0)
Hemoglobin: 14.4 g/dL (ref 13.0–17.0)
Lymphocytes Relative: 23.1 % (ref 12.0–46.0)
Lymphs Abs: 1.5 10*3/uL (ref 0.7–4.0)
MCHC: 34.4 g/dL (ref 30.0–36.0)
MCV: 87.3 fl (ref 78.0–100.0)
MONOS PCT: 8.2 % (ref 3.0–12.0)
Monocytes Absolute: 0.5 10*3/uL (ref 0.1–1.0)
NEUTROS PCT: 64.5 % (ref 43.0–77.0)
Neutro Abs: 4.1 10*3/uL (ref 1.4–7.7)
PLATELETS: 218 10*3/uL (ref 150.0–400.0)
RBC: 4.78 Mil/uL (ref 4.22–5.81)
RDW: 12.6 % (ref 11.5–15.5)
WBC: 6.4 10*3/uL (ref 4.0–10.5)

## 2014-06-10 LAB — BASIC METABOLIC PANEL
BUN: 22 mg/dL (ref 6–23)
CALCIUM: 9.2 mg/dL (ref 8.4–10.5)
CO2: 29 meq/L (ref 19–32)
CREATININE: 1.05 mg/dL (ref 0.40–1.50)
Chloride: 101 mEq/L (ref 96–112)
GFR: 78.21 mL/min (ref >60.00)
GLUCOSE: 83 mg/dL (ref 70–99)
Potassium: 3.9 mEq/L (ref 3.5–5.1)
Sodium: 136 mEq/L (ref 135–145)

## 2014-06-10 LAB — PSA: PSA: 1.22 ng/mL (ref 0.10–4.00)

## 2014-06-10 LAB — HEPATIC FUNCTION PANEL
ALBUMIN: 4.5 g/dL (ref 3.5–5.2)
ALT: 27 U/L (ref 0–53)
AST: 27 U/L (ref 0–37)
Alkaline Phosphatase: 57 U/L (ref 39–117)
BILIRUBIN TOTAL: 0.6 mg/dL (ref 0.2–1.2)
Bilirubin, Direct: 0.1 mg/dL (ref 0.0–0.3)
Total Protein: 7 g/dL (ref 6.0–8.3)

## 2014-06-10 LAB — TSH: TSH: 1.09 u[IU]/mL (ref 0.35–4.50)

## 2014-06-10 NOTE — Addendum Note (Signed)
Addended by: Midge Minium on: 06/10/2014 04:58 PM   Modules accepted: Level of Service

## 2014-07-06 ENCOUNTER — Encounter: Payer: Self-pay | Admitting: Family Medicine

## 2014-08-17 ENCOUNTER — Encounter: Payer: Self-pay | Admitting: Family Medicine

## 2014-10-13 ENCOUNTER — Encounter: Payer: Self-pay | Admitting: Family Medicine

## 2014-10-13 ENCOUNTER — Encounter: Payer: Self-pay | Admitting: Internal Medicine

## 2014-12-20 ENCOUNTER — Ambulatory Visit (AMBULATORY_SURGERY_CENTER): Payer: Self-pay | Admitting: *Deleted

## 2014-12-20 VITALS — Ht 68.0 in | Wt 146.0 lb

## 2014-12-20 DIAGNOSIS — Z1211 Encounter for screening for malignant neoplasm of colon: Secondary | ICD-10-CM

## 2014-12-20 NOTE — Progress Notes (Signed)
No egg or soy allergy. No anesthesia problems.  No home O2.  No diet meds.  

## 2015-01-03 ENCOUNTER — Encounter: Payer: Self-pay | Admitting: Internal Medicine

## 2015-01-03 ENCOUNTER — Ambulatory Visit (AMBULATORY_SURGERY_CENTER): Payer: 59 | Admitting: Internal Medicine

## 2015-01-03 VITALS — BP 120/76 | HR 58 | Temp 97.2°F | Resp 26 | Ht 68.0 in | Wt 148.0 lb

## 2015-01-03 DIAGNOSIS — K635 Polyp of colon: Secondary | ICD-10-CM | POA: Diagnosis not present

## 2015-01-03 DIAGNOSIS — Z1211 Encounter for screening for malignant neoplasm of colon: Secondary | ICD-10-CM | POA: Diagnosis present

## 2015-01-03 DIAGNOSIS — D122 Benign neoplasm of ascending colon: Secondary | ICD-10-CM

## 2015-01-03 MED ORDER — SODIUM CHLORIDE 0.9 % IV SOLN: 500.0000 mL | INTRAVENOUS | Status: DC

## 2015-01-03 NOTE — Progress Notes (Signed)
Called to room to assist during endoscopic procedure.  Patient ID and intended procedure confirmed with present staff. Received instructions for my participation in the procedure from the performing physician.  

## 2015-01-03 NOTE — Progress Notes (Signed)
Report to PACU, RN, vss, BBS= Clear.  

## 2015-01-03 NOTE — Patient Instructions (Signed)
YOU HAD AN ENDOSCOPIC PROCEDURE TODAY AT THE [facility]:   Refer to the procedure report that was given to you for any specific questions about what was found during the examination.  If the procedure report does not answer your questions, please call your gastroenterologist to clarify.  If you requested that your care partner not be given the details of your procedure findings, then the procedure report has been included in a sealed envelope for you to review at your convenience later.  YOU SHOULD EXPECT: Some feelings of bloating in the abdomen. Passage of more gas than usual.  Walking can help get rid of the air that was put into your GI tract during the procedure and reduce the bloating. If you had a lower endoscopy (such as a colonoscopy or flexible sigmoidoscopy) you may notice spotting of blood in your stool or on the toilet paper. If you underwent a bowel prep for your procedure, you may not have a normal bowel movement for a few days.  Please Note:  You might notice some irritation and congestion in your nose or some drainage.  This is from the oxygen used during your procedure.  There is no need for concern and it should clear up in a day or so.  SYMPTOMS TO REPORT IMMEDIATELY:   Following lower endoscopy (colonoscopy or flexible sigmoidoscopy):  Excessive amounts of blood in the stool  Significant tenderness or worsening of abdominal pains  Swelling of the abdomen that is new, acute  Fever of 100F or higher    For urgent or emergent issues, a gastroenterologist can be reached at any hour by calling [phone].   DIET: Your first meal following the procedure should be a small meal and then it is ok to progress to your normal diet. Heavy or fried foods are harder to digest and may make you feel nauseous or bloated.  Likewise, meals heavy in dairy and vegetables can increase bloating.  Drink plenty of fluids but you should avoid alcoholic beverages for 24  hours.  ACTIVITY:  You should plan to take it easy for the rest of today and you should NOT DRIVE or use heavy machinery until tomorrow (because of the sedation medicines used during the test).    FOLLOW UP: Our staff will call the number listed on your records the next business day following your procedure to check on you and address any questions or concerns that you may have regarding the information given to you following your procedure. If we do not reach you, we will leave a message.  However, if you are feeling well and you are not experiencing any problems, there is no need to return our call.  We will assume that you have returned to your regular daily activities without incident.  If any biopsies were taken you will be contacted by phone or by letter within the next 1-3 weeks.  Please call us at [phone] if you have not heard about the biopsies in 3 weeks.    SIGNATURES/CONFIDENTIALITY: You and/or your care partner have signed paperwork which will be entered into your electronic medical record.  These signatures attest to the fact that that the information above on your After Visit Summary has been reviewed and is understood.  Full responsibility of the confidentiality of this discharge information lies with you and/or your care-partner.   Information on polyps given to you today 

## 2015-01-03 NOTE — Op Note (Signed)
[place]  [address] [place], [id number]   COLONOSCOPY PROCEDURE REPORT  PATIENT: Jose, Jacobson  MR#: [id number] BIRTHDATE: 1960-12-21 , 78  yrs. old GENDER: male ENDOSCOPIST: Eustace Quail, MD REFERRED [id number] Birdie Riddle, M.D. PROCEDURE DATE:  01/03/2015 PROCEDURE:   Colonoscopy, screening and Colonoscopy with snare polypectomy x 1 First Screening Colonoscopy - Avg.  risk and is 50 yrs.  old or older Yes.  Prior Negative Screening - Now for repeat screening. N/A  History of Adenoma - Now for follow-up colonoscopy & has been > or = to 3 yrs.  N/A  Polyps removed today? Yes ASA CLASS:   Class I INDICATIONS:Screening for colonic neoplasia and Colorectal Neoplasm Risk Assessment for this procedure is average risk. MEDICATIONS: Monitored anesthesia care and Propofol 300 mg IV  DESCRIPTION OF PROCEDURE:   After the risks benefits and alternatives of the procedure were thoroughly explained, informed consent was obtained.  The digital rectal exam revealed no abnormalities of the rectum.   The LB [id number]  endoscope was introduced through the anus and advanced to the cecum, which was identified by both the appendix and ileocecal valve. No adverse events experienced.   The quality of the prep was good.  (MiraLax was used)  The instrument was then slowly withdrawn as the colon was fully examined. Estimated blood loss is zero unless otherwise noted in this procedure report.      COLON FINDINGS: A single polyp measuring 5 mm in size was found in the ascending colon.  A polypectomy was performed with a cold snare.  The resection was complete, the polyp tissue was completely retrieved and sent to histology.   The examination was otherwise normal.  Retroflexed views revealed internal hemorrhoids. The time to cecum = 4.4 Withdrawal time = 10.9   The scope was withdrawn and the procedure completed. COMPLICATIONS: There were no immediate  complications.  ENDOSCOPIC IMPRESSION: 1.   Single polyp was found in the ascending colon; polypectomy was performed with a cold snare 2.   The examination was otherwise normal  RECOMMENDATIONS: 1. Repeat colonoscopy in 5 years if polyp adenomatous; otherwise 10 years  eSigned:  Eustace Quail, MD 01/03/2015 8:40 AM   cc: Midge Minium, MD and The Patient

## 2015-01-04 ENCOUNTER — Telehealth: Payer: Self-pay

## 2015-01-04 NOTE — Telephone Encounter (Signed)
  Follow up Call-  Call back number 01/03/2015  Post procedure Call Back phone  # [phone]  Permission to leave phone message Yes     Patient questions:  Do you have a fever, pain , or abdominal swelling? No. Pain Score  0 *  Have you tolerated food without any problems? Yes.    Have you been able to return to your normal activities? Yes.    Do you have any questions about your discharge instructions: Diet   No. Medications  No. Follow up visit  No.  Do you have questions or concerns about your Care? No.  Actions: * If pain score is 4 or above: No action needed, pain <4.

## 2015-01-11 ENCOUNTER — Encounter: Payer: Self-pay | Admitting: Internal Medicine

## 2015-01-17 ENCOUNTER — Encounter: Payer: Self-pay | Admitting: Family Medicine

## 2015-06-15 ENCOUNTER — Ambulatory Visit: Payer: 59 | Admitting: Internal Medicine

## 2015-08-04 ENCOUNTER — Encounter: Payer: Self-pay | Admitting: Behavioral Health

## 2015-08-04 ENCOUNTER — Telehealth: Payer: Self-pay | Admitting: Behavioral Health

## 2015-08-04 NOTE — Telephone Encounter (Signed)
Pre-Visit Call completed with patient and chart updated.   Pre-Visit Info documented in Specialty Comments under [employer].    

## 2015-08-07 ENCOUNTER — Encounter: Payer: Self-pay | Admitting: Internal Medicine

## 2015-08-07 ENCOUNTER — Ambulatory Visit (INDEPENDENT_AMBULATORY_CARE_PROVIDER_SITE_OTHER): Payer: 59 | Admitting: Internal Medicine

## 2015-08-07 VITALS — BP 126/82 | HR 47 | Temp 97.7°F | Resp 14 | Ht 68.0 in | Wt 151.4 lb

## 2015-08-07 DIAGNOSIS — Z0000 Encounter for general adult medical examination without abnormal findings: Secondary | ICD-10-CM | POA: Diagnosis not present

## 2015-08-07 DIAGNOSIS — Z09 Encounter for follow-up examination after completed treatment for conditions other than malignant neoplasm: Secondary | ICD-10-CM | POA: Insufficient documentation

## 2015-08-07 LAB — LIPID PANEL
CHOLESTEROL: 187 mg/dL (ref 0–200)
HDL: 61.5 mg/dL (ref >39.00)
LDL CALC: 110 mg/dL — AB (ref 0–99)
NonHDL: 125.71
TRIGLYCERIDES: 80 mg/dL (ref 0.0–149.0)
Total CHOL/HDL Ratio: 3
VLDL: 16 mg/dL (ref 0.0–40.0)

## 2015-08-07 LAB — BASIC METABOLIC PANEL
BUN: 16 mg/dL (ref 6–23)
CHLORIDE: 104 meq/L (ref 96–112)
CO2: 30 mEq/L (ref 19–32)
Calcium: 9.1 mg/dL (ref 8.4–10.5)
Creatinine, Ser: 0.92 mg/dL (ref 0.40–1.50)
GFR: 90.71 mL/min (ref >60.00)
Glucose, Bld: 90 mg/dL (ref 70–99)
POTASSIUM: 4 meq/L (ref 3.5–5.1)
SODIUM: 139 meq/L (ref 135–145)

## 2015-08-07 LAB — PSA: PSA: 1.66 ng/mL (ref 0.10–4.00)

## 2015-08-07 NOTE — Progress Notes (Signed)
Subjective:    Patient ID: Jose Jacobson, male    DOB: [birth year], 55 y.o.   MRN: [id number]  DOS:  08/07/2015 Type of visit - description : New patient to me, CPX Interval history: Feeling well, no major concerns   Review of Systems  Constitutional: No fever. No chills. No unexplained wt changes. No unusual sweats  HEENT: No dental problems, no ear discharge, no facial swelling, no voice changes. No eye discharge, no eye  redness , no  intolerance to light   Respiratory: No wheezing , no  difficulty breathing. No cough , no mucus production  Cardiovascular: No CP, no leg swelling , no  Palpitations  GI: no nausea, no vomiting, no diarrhea , no  abdominal pain.  No blood in the stools. No dysphagia, no odynophagia    Endocrine: No polyphagia, no polyuria , no polydipsia  GU: No dysuria, gross hematuria, difficulty urinating. No urinary urgency, no frequency.  Musculoskeletal: No joint swellings or unusual aches or pains  Skin: No change in the color of the skin, palor , no  Rash  Allergic, immunologic:+ environmental allergies controlled with OTCs, no  food allergies  Neurological: No dizziness no  syncope. No headaches. No diplopia, no slurred, no slurred speech, no motor deficits, no facial  Numbness  Hematological: No enlarged lymph nodes, no easy bruising , no unusual bleedings  Psychiatry: No suicidal ideas, no hallucinations, no beavior problems, no confusion.  No unusual/severe anxiety, no depression  Past Medical History  Diagnosis Date  . 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 Appendectomy    . Testicle torsion reduction      Social History   Social History  . Marital Status: Married    Spouse Name: N/A  . Number of Children: 0  . Years of Education: N/A   Occupational History  . works for [employer], Administrator    Social History Main Topics  . Smoking  status: Never Smoker   . Smokeless tobacco: Never Used  . Alcohol Use: 0.0 oz/week    0 Standard drinks or equivalent per week     Comment: social  . Drug Use: No  . Sexual Activity: Not on file   Other Topics Concern  . Not on file   Social History Narrative     Family History  Problem Relation Age of Onset  . Heart disease Maternal Grandmother   . Heart disease Maternal Grandfather   . Heart disease Paternal Grandmother   . Heart disease Paternal Grandfather   . Heart disease Mother 18    aortic valve replaced  . Benign prostatic hyperplasia Father   . Esophageal cancer Father 25    esophogeal  . Prostate cancer Brother 60    prostate  . Colon cancer Neg Hx        Medication List       This list is accurate as of: 08/07/15 10:32 AM.  Always use your most recent med list.               MULTIVITAMIN ADULT PO  Take 1 tablet by mouth daily.           Objective:   Physical Exam BP 126/82 mmHg  Pulse 47  Temp(Src) 97.7 F (36.5 C) (Oral)  Resp 14  Ht 5\' 8"  (1.727 m)  Wt 151 lb  6.4 oz (68.675 kg)  BMI 23.03 kg/m2  SpO2 99%  General:   Well developed, well nourished . NAD.  Neck: No  thyromegaly , normal carotid pulse HEENT:  Normocephalic . Face symmetric, atraumatic Lungs:  CTA B Normal respiratory effort, no intercostal retractions, no accessory muscle use. Heart: RRR,  no murmur.  No pretibial edema bilaterally  Abdomen:  Not distended, soft, non-tender. No rebound or rigidity.   Rectal:  External abnormalities: skin tag. Normal sphincter tone. No rectal masses or tenderness.  No stools found Prostate: Prostate gland firm and smooth, no enlargement, nodularity, tenderness, mass, asymmetry or induration.  Skin: Exposed areas without rash. Not pale. Not jaundice Neurologic:  alert & oriented X3.  Speech normal, gait appropriate for age and unassisted Strength symmetric and appropriate for age.  Psych: Cognition and judgment appear intact.    Cooperative with normal attention span and concentration.  Behavior appropriate. No anxious or depressed appearing.    Assessment & Plan:   Assessment Testicular torsion, age 55 SCC , R hand , sees derm Dr Allyson Sabal  PLAN: Here for a CPX, doing very well, RTC one year

## 2015-08-07 NOTE — Patient Instructions (Signed)
GO TO THE LAB : Get the blood work     GO TO THE FRONT DESK Schedule your next appointment for a physical exam in one year, fasting   

## 2015-08-07 NOTE — Assessment & Plan Note (Signed)
Here for a CPX, doing very well, RTC one year

## 2015-08-07 NOTE — Progress Notes (Signed)
Pre visit review using our clinic review tool, if applicable. No additional management support is needed unless otherwise documented below in the visit note. 

## 2015-08-07 NOTE — Assessment & Plan Note (Addendum)
Td 06-2014 Colonoscopy 12-2014, 1 polyp, hyperplastic, 10 years Prostate cancer screening:  +FH cancer, Brother dx age 55; also  father and other brother w/BPH. DRE normal today, check a PSA   Diet and exercise discussed

## 2015-09-22 DIAGNOSIS — I788 Other diseases of capillaries: Secondary | ICD-10-CM | POA: Diagnosis not present

## 2015-09-22 DIAGNOSIS — L820 Inflamed seborrheic keratosis: Secondary | ICD-10-CM | POA: Diagnosis not present

## 2015-09-22 DIAGNOSIS — L814 Other melanin hyperpigmentation: Secondary | ICD-10-CM | POA: Diagnosis not present

## 2015-09-22 DIAGNOSIS — L84 Corns and callosities: Secondary | ICD-10-CM | POA: Diagnosis not present

## 2015-09-22 DIAGNOSIS — D235 Other benign neoplasm of skin of trunk: Secondary | ICD-10-CM | POA: Diagnosis not present

## 2015-09-22 DIAGNOSIS — L821 Other seborrheic keratosis: Secondary | ICD-10-CM | POA: Diagnosis not present

## 2015-09-22 DIAGNOSIS — L570 Actinic keratosis: Secondary | ICD-10-CM | POA: Diagnosis not present

## 2015-09-22 DIAGNOSIS — D1801 Hemangioma of skin and subcutaneous tissue: Secondary | ICD-10-CM | POA: Diagnosis not present

## 2015-11-17 ENCOUNTER — Telehealth: Payer: Self-pay | Admitting: Internal Medicine

## 2015-11-17 NOTE — Telephone Encounter (Signed)
I agree with a offer to see him same day but the patient declined. We'll see him next week.

## 2015-11-17 NOTE — Telephone Encounter (Signed)
Called pt and he states the sx improved over the weekend and he is feeling better. Fwding to nurses as fyi in case they feel need to f/u with pt.   << Less Detail',event)" href="javascript:;">More Detail >>  Appointment Request  [name]   [name]  MRN: [id number] DOB: 01/07/1961  Pt Work: [phone] Pt Home: [phone]  Entered: [id number]      Sent: Molli Knock November 13, 2015 7:46 AM  To: [facility] Admin Pool  Message   Appointment Request From: Jose Jacobson    With Provider: Kathlene November, [place] [facility] [place]]    Preferred Date Range: From 11/13/2015 To 11/13/2015    Preferred Times: Any    Reason for visit: Office Visit    Comments:  Yesterday morning when I got up I was dizzy when walking. The dizziness went away but while reading the newspaper I was sweating. So I went to bed. I slept until about 2pm. I sorta felt better and I could sit up not sweating but I felt like I had to walk gently. I finally ate some food at about 4pm. When I went to bed around 8pm, I brushed my teeth but the downward motion made me dizzy and I unfortunately had to vomit. I slept last night very good. This morning I feel about 80%. Not sure if this is something that will pass or I need to see the doctor. In any case, if this sounds like I need an appointment to see Dr. Larose Kells, please schedule an appointment for me.  Thanks,   [name]

## 2015-11-17 NOTE — Telephone Encounter (Signed)
Called pt. He reports he is feeling better, but not completely back to normal. Endorses sinus pressure and feeling dizzy at night when getting up to go to the bathroom and in the mornings when he first gets up. Dizziness resolves after he is standing upright for a few minutes. Denies SOB, CP, HA, numbness/weakness of extremities, fever, cough, congestion, syncope/falls, loss of consciousness. Offered pt same day appt, and he declined stating that since he was feeling better, he would like to wait until Monday and would schedule appt at that time if his symptoms were not continuing to improve. Discussed alarm symptoms/red flags that should prompt ED eval over the weekend and pt agreed to plan.

## 2016-01-01 ENCOUNTER — Encounter: Payer: Self-pay | Admitting: Internal Medicine

## 2016-01-02 ENCOUNTER — Ambulatory Visit (INDEPENDENT_AMBULATORY_CARE_PROVIDER_SITE_OTHER): Payer: 59 | Admitting: Internal Medicine

## 2016-01-02 ENCOUNTER — Encounter: Payer: Self-pay | Admitting: Internal Medicine

## 2016-01-02 VITALS — BP 108/64 | HR 51 | Temp 98.3°F | Resp 14 | Ht 68.0 in | Wt 148.1 lb

## 2016-01-02 DIAGNOSIS — K640 First degree hemorrhoids: Secondary | ICD-10-CM | POA: Diagnosis not present

## 2016-01-02 NOTE — Progress Notes (Signed)
   Subjective:    Patient ID: Jose Jacobson, male    DOB: 03/11/1961, 55 y.o.   MRN: [id number]  DOS:  01/02/2016 Type of visit - description : acute Interval history: Symptoms started 3 days ago with sudden onset of rectal pain, mild to moderate, at the same time he felt a lump in the  Area. Since then, the pain has subsided, bulging area is about the same. Patient suspects hemorrhoids. No history of such before   Review of Systems Fever chills No nausea vomiting No rectal bleeding, rectal mucus. Bowel movements are daily and normal  Past Medical History:  Diagnosis Date  . Carcinoma ([place]) 2015   of hand  . 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 APPENDECTOMY    . TESTICLE TORSION REDUCTION      Social History   Social History  . Marital status: Married    Spouse name: N/A  . Number of children: 0  . Years of education: N/A   Occupational History  . works for [employer], Administrator    Social History Main Topics  . Smoking status: Never Smoker  . Smokeless tobacco: Never Used  . Alcohol use 0.0 oz/week     Comment: social  . Drug use: No  . Sexual activity: Not on file   Other Topics Concern  . Not on file   Social History Narrative  . No narrative on file        Medication List       Accurate as of 01/02/16  3:13 PM. Always use your most recent med list.          MULTIVITAMIN ADULT PO Take 1 tablet by mouth daily.          Objective:   Physical Exam BP 108/64 (BP Location: Left Arm, Patient Position: Sitting, Cuff Size: Normal)   Pulse (!) 51   Temp 98.3 F (36.8 C) (Oral)   Resp 14   Ht 5\' 8"  (1.727 m)   Wt 148 lb 2 oz (67.2 kg)   SpO2 98%   BMI 22.52 kg/m  General:   Well developed, well nourished . NAD.  HEENT:  Normocephalic . Face symmetric, atraumatic Abdomen:  Not distended, soft, non-tender. No rebound or rigidity.  Rectal:  External : At the left lateral anal area has  a 1 cm protruded external hemorrhoid, not warm, no red or TTP. It is soft. Normal sphincter tone. No rectal masses or tenderness. No stools found. Anoscopy: Has 2 very small hemorrhoids internally. Prostate: Prostate gland firm and smooth, no enlargement  Skin: Not pale. Not jaundice Neurologic:  alert & oriented X3.  Speech normal, gait appropriate for age and unassisted Psych--  Cognition and judgment appear intact.  Cooperative with normal attention span and concentration.  Behavior appropriate. No anxious or depressed appearing.     Assessment & Plan:   Assessment Testicular torsion, age 70 SCC , R hand , sees derm Dr Allyson Sabal Hemorrhoid, ext  DX [phone]  PLAN: External hemorrhoid: Has external hemorrhoid without apparent complication. Short-term treatment with Preparation H.  Long-term treatment with Metamucil and stool softeners if needed. If he develops pain/bleeding or the area is not decreasing in size gradually he is to let me know

## 2016-01-02 NOTE — Patient Instructions (Signed)
Continue using Preparation H also OTC hydrocortisone cream if you feel slightly irritated  Call if pain, bleeding, discharge; call if the  area is not getting better in the next couple of weeks  Metamucil one or 2 capsules daily with fluids, stool softeners as needed   About Hemorrhoids  Hemorrhoids are swollen veins in the lower rectum and anus.  Also called piles, hemorrhoids are a common problem.  Hemorrhoids may be internal (inside the rectum) or external (around the anus).  Internal Hemorrhoids  Internal hemorrhoids are often painless, but they rarely cause bleeding.  The internal veins may stretch and fall down (prolapse) through the anus to the outside of the body.  The veins may then become irritated and painful.  External Hemorrhoids  External hemorrhoids can be easily seen or felt around the anal opening.  They are under the skin around the anus.  When the swollen veins are scratched or broken by straining, rubbing or wiping they sometimes bleed.  How Hemorrhoids Occur  Veins in the rectum and around the anus tend to swell under pressure.  Hemorrhoids can result from increased pressure in the veins of your anus or rectum.  Some sources of pressure are:   Straining to have a bowel movement because of constipation  Waiting too long to have a bowel movement  Coughing and sneezing often  Sitting for extended periods of time, including on the toilet  Diarrhea  Obesity  Trauma or injury to the anus  Some liver diseases  Stress  Family history of hemorrhoids  Pregnancy  Pregnant women should try to avoid becoming constipated, because they are more likely to have hemorrhoids during pregnancy.  In the last trimester of pregnancy, the enlarged uterus may press on blood vessels and causes hemorrhoids.  In addition, the strain of childbirth sometimes causes hemorrhoids after the birth.  Symptoms of Hemorrhoids  Some symptoms of hemorrhoids include:  Swelling and/or a  tender lump around the anus  Itching, mild burning and bleeding around the anus  Painful bowel movements with or without constipation  Bright red blood covering the stool, on toilet paper or in the toilet bowel.   Symptoms usually go away within a few days.  Always talk to your doctor about any bleeding to make sure it is not from some other causes.  Diagnosing and Treating Hemorrhoids  Diagnosis is made by an examination by your healthcare provider.  Special test can be performed by your doctor.    Most cases of hemorrhoids can be treated with:  High-fiber diet: Eat more high-fiber foods, which help prevent constipation.  Ask for more detailed fiber information on types and sources of fiber from your healthcare provider.  Fluids: Drink plenty of water.  This helps soften bowel movements so they are easier to pass.  Sitz baths and cold packs: Sitting in lukewarm water two or three times a day for 15 minutes cleases the anal area and may relieve discomfort.  If the water is too hot, swelling around the anus will get worse.  Placing a cloth-covered ice pack on the anus for ten minutes four times a day can also help reduce selling.  Gently pushing a prolapsed hemorrhoid back inside after the bath or ice pack can be helpful.  Medications: For mild discomfort, your healthcare provider may suggest over-the-counter pain medication or prescribe a cream or ointment for topical use.  The cream may contain witch hazel, zinc oxide or petroleum jelly.  Medicated suppositories are also a treatment  option.  Always consult your doctor before applying medications or creams.  Procedures and surgeries: There are also a number of procedures and surgeries to shrink or remove hemorrhoids in more serious cases.  Talk to your physician about these options.  You can often prevent hemorrhoids or keep them from becoming worse by maintaining a healthy lifestyle.  Eat a fiber-rich diet of fruits, vegetables and whole  grains.  Also, drink plenty of water and exercise regularly.   2007, [employer] Doc.30

## 2016-01-02 NOTE — Progress Notes (Signed)
Pre visit review using our clinic review tool, if applicable. No additional management support is needed unless otherwise documented below in the visit note. 

## 2016-01-03 DIAGNOSIS — K649 Unspecified hemorrhoids: Secondary | ICD-10-CM | POA: Insufficient documentation

## 2016-01-03 NOTE — Assessment & Plan Note (Signed)
External hemorrhoid: Has external hemorrhoid without apparent complication. Short-term treatment with Preparation H.  Long-term treatment with Metamucil and stool softeners if needed. If he develops pain/bleeding or the area is not decreasing in size gradually he is to let me know

## 2016-08-08 ENCOUNTER — Encounter: Payer: Self-pay | Admitting: Internal Medicine

## 2016-08-12 ENCOUNTER — Encounter: Payer: Self-pay | Admitting: Internal Medicine

## 2016-08-12 ENCOUNTER — Ambulatory Visit (INDEPENDENT_AMBULATORY_CARE_PROVIDER_SITE_OTHER): Payer: 59 | Admitting: Internal Medicine

## 2016-08-12 VITALS — BP 118/64 | HR 49 | Temp 98.0°F | Resp 14 | Ht 68.0 in | Wt 146.2 lb

## 2016-08-12 DIAGNOSIS — Z0000 Encounter for general adult medical examination without abnormal findings: Secondary | ICD-10-CM | POA: Diagnosis not present

## 2016-08-12 LAB — CBC WITH DIFFERENTIAL/PLATELET
BASOS ABS: 0.1 10*3/uL (ref 0.0–0.1)
BASOS PCT: 2.7 % (ref 0.0–3.0)
EOS ABS: 0.2 10*3/uL (ref 0.0–0.7)
Eosinophils Relative: 5.1 % — ABNORMAL HIGH (ref 0.0–5.0)
HEMATOCRIT: 41.9 % (ref 39.0–52.0)
HEMOGLOBIN: 14.3 g/dL (ref 13.0–17.0)
LYMPHS PCT: 24.3 % (ref 12.0–46.0)
Lymphs Abs: 0.9 10*3/uL (ref 0.7–4.0)
MCHC: 34.2 g/dL (ref 30.0–36.0)
MCV: 89.5 fl (ref 78.0–100.0)
MONO ABS: 0.4 10*3/uL (ref 0.1–1.0)
Monocytes Relative: 10.7 % (ref 3.0–12.0)
Neutro Abs: 2 10*3/uL (ref 1.4–7.7)
Neutrophils Relative %: 57.2 % (ref 43.0–77.0)
PLATELETS: 198 10*3/uL (ref 150.0–400.0)
RBC: 4.68 Mil/uL (ref 4.22–5.81)
RDW: 12.5 % (ref 11.5–15.5)
WBC: 3.5 10*3/uL — AB (ref 4.0–10.5)

## 2016-08-12 LAB — TSH: TSH: 1.01 u[IU]/mL (ref 0.35–4.50)

## 2016-08-12 LAB — COMPREHENSIVE METABOLIC PANEL
ALBUMIN: 4.1 g/dL (ref 3.5–5.2)
ALT: 31 U/L (ref 0–53)
AST: 27 U/L (ref 0–37)
Alkaline Phosphatase: 46 U/L (ref 39–117)
BILIRUBIN TOTAL: 0.7 mg/dL (ref 0.2–1.2)
BUN: 17 mg/dL (ref 6–23)
CALCIUM: 9.1 mg/dL (ref 8.4–10.5)
CO2: 29 mEq/L (ref 19–32)
Chloride: 105 mEq/L (ref 96–112)
Creatinine, Ser: 1.04 mg/dL (ref 0.40–1.50)
GFR: 78.45 mL/min (ref >60.00)
Glucose, Bld: 94 mg/dL (ref 70–99)
Potassium: 4.3 mEq/L (ref 3.5–5.1)
Sodium: 139 mEq/L (ref 135–145)
Total Protein: 6.7 g/dL (ref 6.0–8.3)

## 2016-08-12 LAB — LIPID PANEL
CHOL/HDL RATIO: 3
CHOLESTEROL: 186 mg/dL (ref 0–200)
HDL: 59.9 mg/dL (ref >39.00)
LDL CALC: 109 mg/dL — AB (ref 0–99)
NONHDL: 125.73
Triglycerides: 86 mg/dL (ref 0.0–149.0)
VLDL: 17.2 mg/dL (ref 0.0–40.0)

## 2016-08-12 NOTE — Progress Notes (Signed)
Pre visit review using our clinic review tool, if applicable. No additional management support is needed unless otherwise documented below in the visit note. 

## 2016-08-12 NOTE — Patient Instructions (Signed)
GO TO THE LAB : Get the blood work     GO TO THE FRONT DESK Schedule your next appointment for a  Physical in 1 year , fasting

## 2016-08-12 NOTE — Progress Notes (Signed)
Subjective:    Patient ID: Jose Jacobson, male    DOB: [birth year], 56 y.o.   MRN: [id number]  DOS:  08/12/2016 Type of visit - description : cpx Interval history: No concerns   Review of Systems  A 14 point review of systems is negative    Past Medical History:  Diagnosis Date  . Carcinoma ([place]) 2015   of hand  . 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 APPENDECTOMY    . TESTICLE TORSION REDUCTION      Social History   Social History  . Marital status: Married    Spouse name: N/A  . Number of children: 0  . Years of education: N/A   Occupational History  . works for [employer], tracks med devides recalls    Social History Main Topics  . Smoking status: Never Smoker  . Smokeless tobacco: Never Used  . Alcohol use 0.0 oz/week     Comment: social  . Drug use: No  . Sexual activity: Not on file   Other Topics Concern  . Not on file   Social History Narrative  . No narrative on file     Family History  Problem Relation Age of Onset  . Heart disease Mother 76    aortic valve replaced  . Benign prostatic hyperplasia Father   . Esophageal cancer Father 55    esophogeal  . Prostate cancer Brother 33    prostate  . Heart disease Maternal Grandmother   . Heart disease Maternal Grandfather   . Heart disease Paternal Grandmother   . Heart disease Paternal Grandfather   . Colon cancer Neg Hx      Allergies as of 08/12/2016      Reactions   Prednisone Nausea And Vomiting      Medication List       Accurate as of 08/12/16 11:59 PM. Always use your most recent med list.          loratadine 10 MG tablet Commonly known as:  CLARITIN Take 10 mg by mouth daily as needed for allergies or rhinitis.   MULTIVITAMIN ADULT PO Take 1 tablet by mouth daily.          Objective:   Physical Exam BP 118/64 (BP Location: Left Arm, Patient Position: Sitting, Cuff Size: Small)   Pulse (!) 49   Temp 98 F  (36.7 C) (Oral)   Resp 14   Ht 5\' 8"  (1.727 m)   Wt 146 lb 4 oz (66.3 kg)   SpO2 98%   BMI 22.24 kg/m   General:   Well developed, well nourished . NAD.  Neck: No  thyromegaly  HEENT:  Normocephalic . Face symmetric, atraumatic Lungs:  CTA B Normal respiratory effort, no intercostal retractions, no accessory muscle use. Heart: RRR,  no murmur.  No pretibial edema bilaterally  Abdomen:  Not distended, soft, non-tender. No rebound or rigidity.   Skin: Exposed areas without rash. Not pale. Not jaundice Neurologic:  alert & oriented X3.  Speech normal, gait appropriate for age and unassisted Strength symmetric and appropriate for age.  Psych: Cognition and judgment appear intact.  Cooperative with normal attention span and concentration.  Behavior appropriate. No anxious or depressed appearing.    Assessment & Plan:   Assessment Testicular torsion, age 16 SCC , R hand , sees derm Dr Allyson Sabal Hemorrhoid, ext  DX [phone]  PLAN: Here for CPX  Scc: Sees dermatology regularly, good compliance with sun avoidance. RTC one year

## 2016-08-12 NOTE — Assessment & Plan Note (Addendum)
--  Td 06-2014 --Colonoscopy 12-2014, 1 polyp, hyperplastic, 10 years --Prostate cancer screening:  +FH cancer, Brother dx age 56; also  father and other brother w/BPH. DRE wnl 2017. PSA consistently wnl. Reassess 2019 --Diet and exercise -has an excellent lifestyle with a healthy diet, exercise daily. --Labs: See orders RTC one year

## 2016-08-13 ENCOUNTER — Encounter: Payer: Self-pay | Admitting: Internal Medicine

## 2016-08-13 NOTE — Assessment & Plan Note (Signed)
Here for CPX Scc: Sees dermatology regularly, good compliance with sun avoidance. RTC one year

## 2016-09-20 DIAGNOSIS — L570 Actinic keratosis: Secondary | ICD-10-CM | POA: Diagnosis not present

## 2016-09-20 DIAGNOSIS — L821 Other seborrheic keratosis: Secondary | ICD-10-CM | POA: Diagnosis not present

## 2016-09-20 DIAGNOSIS — D239 Other benign neoplasm of skin, unspecified: Secondary | ICD-10-CM | POA: Diagnosis not present

## 2016-09-20 DIAGNOSIS — D1801 Hemangioma of skin and subcutaneous tissue: Secondary | ICD-10-CM | POA: Diagnosis not present

## 2016-09-20 DIAGNOSIS — L579 Skin changes due to chronic exposure to nonionizing radiation, unspecified: Secondary | ICD-10-CM | POA: Diagnosis not present

## 2017-01-31 DIAGNOSIS — D1801 Hemangioma of skin and subcutaneous tissue: Secondary | ICD-10-CM | POA: Diagnosis not present

## 2017-01-31 DIAGNOSIS — B354 Tinea corporis: Secondary | ICD-10-CM | POA: Diagnosis not present
